# Patient Record
Sex: Female | Born: 1945 | Race: White | Hispanic: No | Marital: Single | Smoking: Never smoker
Health system: Southern US, Community
[De-identification: ages and names within clinical notes are randomized; demographics above are authoritative.]

## PROBLEM LIST (undated history)

## (undated) DIAGNOSIS — S42309A Unspecified fracture of shaft of humerus, unspecified arm, initial encounter for closed fracture: Secondary | ICD-10-CM

## (undated) HISTORY — PX: KNEE SURGERY: SHX244

## (undated) HISTORY — PX: TONSILLECTOMY AND ADENOIDECTOMY: SHX28

## (undated) HISTORY — PX: ABDOMINAL HYSTERECTOMY: SHX81

---

## 2021-07-19 ENCOUNTER — Other Ambulatory Visit: Payer: Self-pay

## 2021-07-19 ENCOUNTER — Encounter (HOSPITAL_COMMUNITY): Payer: Self-pay | Admitting: Emergency Medicine

## 2021-07-19 ENCOUNTER — Ambulatory Visit (INDEPENDENT_AMBULATORY_CARE_PROVIDER_SITE_OTHER): Payer: Medicare Other

## 2021-07-19 ENCOUNTER — Ambulatory Visit (HOSPITAL_COMMUNITY)
Admission: EM | Admit: 2021-07-19 | Discharge: 2021-07-19 | Disposition: A | Discharge status: Home / Self Care | Payer: Medicare Other | Attending: Internal Medicine | Admitting: Internal Medicine

## 2021-07-19 DIAGNOSIS — K5900 Constipation, unspecified: Secondary | ICD-10-CM | POA: Diagnosis not present

## 2021-07-19 DIAGNOSIS — R030 Elevated blood-pressure reading, without diagnosis of hypertension: Secondary | ICD-10-CM

## 2021-07-19 DIAGNOSIS — M62830 Muscle spasm of back: Secondary | ICD-10-CM | POA: Diagnosis not present

## 2021-07-19 DIAGNOSIS — H469 Unspecified optic neuritis: Secondary | ICD-10-CM | POA: Diagnosis not present

## 2021-07-19 DIAGNOSIS — S39012A Strain of muscle, fascia and tendon of lower back, initial encounter: Secondary | ICD-10-CM | POA: Diagnosis not present

## 2021-07-19 DIAGNOSIS — R102 Pelvic and perineal pain: Secondary | ICD-10-CM

## 2021-07-19 DIAGNOSIS — R1032 Left lower quadrant pain: Secondary | ICD-10-CM | POA: Diagnosis not present

## 2021-07-19 HISTORY — DX: Unspecified fracture of shaft of humerus, unspecified arm, initial encounter for closed fracture: S42.309A

## 2021-07-19 LAB — POCT URINALYSIS DIPSTICK, ED / UC
Bilirubin Urine: NEGATIVE
Glucose, UA: NEGATIVE mg/dL
Hgb urine dipstick: NEGATIVE
Ketones, ur: NEGATIVE mg/dL
Leukocytes,Ua: NEGATIVE
Nitrite: NEGATIVE
Protein, ur: NEGATIVE mg/dL
Specific Gravity, Urine: 1.01 (ref 1.005–1.030)
Urobilinogen, UA: 0.2 mg/dL (ref 0.0–1.0)
pH: 6 (ref 5.0–8.0)

## 2021-07-19 MED ORDER — BACLOFEN 10 MG PO TABS: ORAL_TABLET | ORAL | 0 refills | Status: AC

## 2021-07-19 MED ORDER — HYDROCODONE-ACETAMINOPHEN 5-325 MG PO TABS: ORAL_TABLET | ORAL | 0 refills | Status: AC

## 2021-07-19 NOTE — Discharge Instructions (Addendum)
Get Magnesium citrate bottle and drink it all to help you empty your bowels. Then from then on, to prevent it, take Magnesium citrate 200 mg to 400 mg at bed time. If your stool gets very loose back up on your dose. Also increase your fiber and water in your diet.  Do blood pressure diaries and record them to take it to your new family doctor next week.  Avoid taking Celebrex until your blood pressure is down to normal.

## 2021-07-19 NOTE — ED Provider Notes (Signed)
[facility]    CSN: [id number] Arrival date & time: 07/19/21  [id number]      History   Chief Complaint Chief Complaint  Patient presents with   Back Pain    HPI Jean Cannon is a 75 y.o. female who presents with L back pain/ hip pain  x 3 weeks. Stated after she bent down to tie her shoes. Pain is radiating to L abdomen. Today she is aching all over. Her clothes are even painful on her skin, but denies having a rash. Pain on hip is described as pulsating, and LLQ pain feels "crampy", L back feels " pulling pain" Felt beter this past weekend and was able to sleep. After getting out of bed Sunday( 2 days ago) the pain got worse again and since then is moving to suprapubic region. She denies constipation, dysuria, cloudy urine. Has been having HA on temples x 4-5 days. Denies vision changes. Admits of poor sleep in the past 3 weeks due to pain, and also been under a lot of stress. Denies Hx of HTN.  She is up to date on her colonoscopy and her last one was normal. Has had a hysterectomy. She has tried Tylenol, Tramadol, Celebrex and heat for pain, but has not helped.    Past Medical History:  Diagnosis Date   Arm fracture     There are no problems to display for this patient.   Past Surgical History:  Procedure Laterality Date   ABDOMINAL HYSTERECTOMY     KNEE SURGERY Right    TONSILLECTOMY AND ADENOIDECTOMY      OB History   No obstetric history on file.      Home Medications    Prior to Admission medications   Medication Sig Start Date End Date Taking? Authorizing Provider  baclofen (LIORESAL) 10 MG tablet 1-2 tid for muscle spasm and tightness 07/19/21  Yes Rodriguez-Southworth, Sunday Spillers, PA-C  HYDROcodone-acetaminophen (NORCO) 5-325 MG tablet 1/2 to 1 q 6h prn pain 07/19/21  Yes Rodriguez-Southworth, Sunday Spillers, PA-C    Family History Family History  Problem Relation Age of Onset   Alzheimer's disease Father     Social History Social History    Tobacco Use   Smoking status: Never   Smokeless tobacco: Never  Vaping Use   Vaping Use: Never used  Substance Use Topics   Alcohol use: Yes     Allergies   Advil [ibuprofen], Claritin [loratadine], Cortisone, and Zyrtec [cetirizine]   Review of Systems Review of Systems  Constitutional:  Positive for fatigue. Negative for appetite change, chills and fever.  HENT:  Negative for congestion.   Eyes:  Negative for visual disturbance.  Respiratory:  Negative for cough, chest tightness and shortness of breath.   Cardiovascular:  Negative for chest pain and leg swelling.  Gastrointestinal:  Positive for abdominal pain. Negative for abdominal distention, constipation, diarrhea, nausea and vomiting.  Genitourinary:  Positive for pelvic pain. Negative for difficulty urinating, dysuria, flank pain and frequency.  Musculoskeletal:  Positive for back pain. Negative for gait problem.  Skin:  Negative for color change, pallor, rash and wound.  Neurological:  Positive for headaches.       Temple HA  Hematological:  Negative for adenopathy.    Physical Exam Triage Vital Signs ED Triage Vitals  Enc Vitals Group     BP 07/19/21 1001 (!) 204/96     Pulse Rate 07/19/21 1001 80     Resp 07/19/21 1001 18     Temp 07/19/21  1001 98.4 F (36.9 C)     Temp Source 07/19/21 1001 Oral     SpO2 07/19/21 1001 99 %     Weight --      Height --      Head Circumference --      Peak Flow --      Pain Score 07/19/21 0953 10     Pain Loc --      Pain Edu? --      Excl. in GC? --    No data found.  Updated Vital Signs BP (!) 215/106 (BP Location: Right Arm) Comment (BP Location): repositioned   Pulse 80    Temp 98.4 F (36.9 C) (Oral)    Resp 18    SpO2 99%   Visual Acuity Right Eye Distance:   Left Eye Distance:   Bilateral Distance:    Right Eye Near:   Left Eye Near:    Bilateral Near:     Physical Exam Vitals and nursing note reviewed.  Constitutional:      General: She is in  acute distress.     Appearance: She is not ill-appearing or toxic-appearing.  HENT:     Head: Normocephalic.     Right Ear: External ear normal.     Left Ear: External ear normal.  Eyes:     General: No scleral icterus.    Extraocular Movements: Extraocular movements intact.     Conjunctiva/sclera: Conjunctivae normal.     Pupils: Pupils are equal, round, and reactive to light.  Cardiovascular:     Rate and Rhythm: Normal rate and regular rhythm.  Pulmonary:     Effort: Pulmonary effort is normal.     Breath sounds: Normal breath sounds.  Abdominal:     General: Bowel sounds are normal.     Palpations: Abdomen is soft.     Tenderness: There is no right CVA tenderness or left CVA tenderness.     Comments: Has mild tenderness on L and R mid abdomen, with no rebound or guarding  Musculoskeletal:        General: Normal range of motion.     Cervical back: Neck supple.     Comments: BACK- has local tenderness on L mid muscular area and over L iliac crest which did  reproduced her pain and when raising back up from anterior flexion. Spine ROM is normal and felt mild pain on lateral back area with lateral flexion. Neg SLR  Skin:    General: Skin is warm and dry.     Findings: No rash.     Comments: Has mild hyperalgesia over L lateral thorax and lateral hip, no rashes noted.   Neurological:     Mental Status: She is alert and oriented to person, place, and time.     Cranial Nerves: No cranial nerve deficit.     Motor: No weakness.     Gait: Gait normal.     Deep Tendon Reflexes: Reflexes normal.  Psychiatric:        Mood and Affect: Mood normal.        Behavior: Behavior normal.        Thought Content: Thought content normal.        Judgment: Judgment normal.     UC Treatments / Results  Labs (all labs ordered are listed, but only abnormal results are displayed) Labs Reviewed  URINALYSIS, ROUTINE W REFLEX MICROSCOPIC  POCT URINALYSIS DIPSTICK, ED / UC    EKG NSR, normal  EKG  Radiology DG Abd 2 Views  Result Date: 07/19/2021 CLINICAL DATA:  Left lower quadrant and suprapubic pain for 3 days EXAM: ABDOMEN - 2 VIEW COMPARISON:  None. FINDINGS: Generalized colonic stool. No concerning mass effect or gas collection. Clear lung bases. IMPRESSION: Diffuse colonic stool.  No impaction or obstruction. Electronically Signed   By: Jorje Guild M.D.   On: 07/19/2021 10:47    Procedures Procedures (including critical care time)  Medications Ordered in UC Medications - No data to display  Initial Impression / Assessment and Plan / UC Course  I have reviewed the triage vital signs and the nursing notes. Pertinent labs & imaging results that were available during my care of the patient were reviewed by me and considered in my medical decision making (see chart for details). L Lumbar strain with muscle spasm and neuritis along L3 dermatome. She may have had shingles with no rash, but she has had this for 10 days and antiviral will not help. She has allergy to Steroid, so cant place her on this to help the hyperalgesia.  Constipation- instructed to get Mag citrate. See instructions. I placed her on Baclofen and Norco. Medication precautions given.  Elevation of BP could just be from pain and lack of sleep, but she will monitor her BP and take BP diaries to her new PCP whom she will see next week when she returns to [place].      Final Clinical Impressions(s) / UC Diagnoses   Final diagnoses:  Strain of lumbar region, initial encounter  Muscle spasm of back  Optic neuritis  Elevated blood pressure, situational  Constipation, unspecified constipation type     Discharge Instructions      Get Magnesium citrate bottle and drink it all to help you empty your bowels. Then from then on, to prevent it, take Magnesium citrate 200 mg to 400 mg at bed time. If your stool gets very loose back up on your dose. Also increase your fiber and water in your diet.  Do blood  pressure diaries and record them to take it to your new family doctor next week.  Avoid taking Celebrex until your blood pressure is down to normal.      ED Prescriptions     Medication Sig Dispense Auth. Provider   baclofen (LIORESAL) 10 MG tablet 1-2 tid for muscle spasm and tightness 30 each Rodriguez-Southworth, Burrell Hodapp, PA-C   HYDROcodone-acetaminophen (NORCO) 5-325 MG tablet 1/2 to 1 q 6h prn pain 10 tablet Rodriguez-Southworth, Sunday Spillers, PA-C      I have reviewed the PDMP during this encounter.   Shelby Mattocks, PA-C 07/19/21 1211

## 2021-07-19 NOTE — ED Triage Notes (Signed)
2 weeks ago, bent down to tie shoe and couldn't get back up.  Pain in left hip.  Then started getting worse.  Pain worsens at night.  Pain is now radiating to left lower back and radiating around to abdomen.  Today is aching all over .  And reports clothing is painful on skin.  Denies rash Have tried tylenol, extra strength, had one tramadol to try and had celebrex to try (left over medicines).  Neither helped.  Patient has used heating pad.  "Pulsing pain" in hip, left lower abdomen feels like cramps and lower left back feels pulling pain

## 2022-12-22 IMAGING — DX DG ABDOMEN 2V
3 series · 3 of 3 positions shown · non-contrast
Comparison: None.

CLINICAL DATA: Left lower quadrant and suprapubic pain for 3 days

EXAM:
ABDOMEN - 2 VIEW

[abdomen erect]
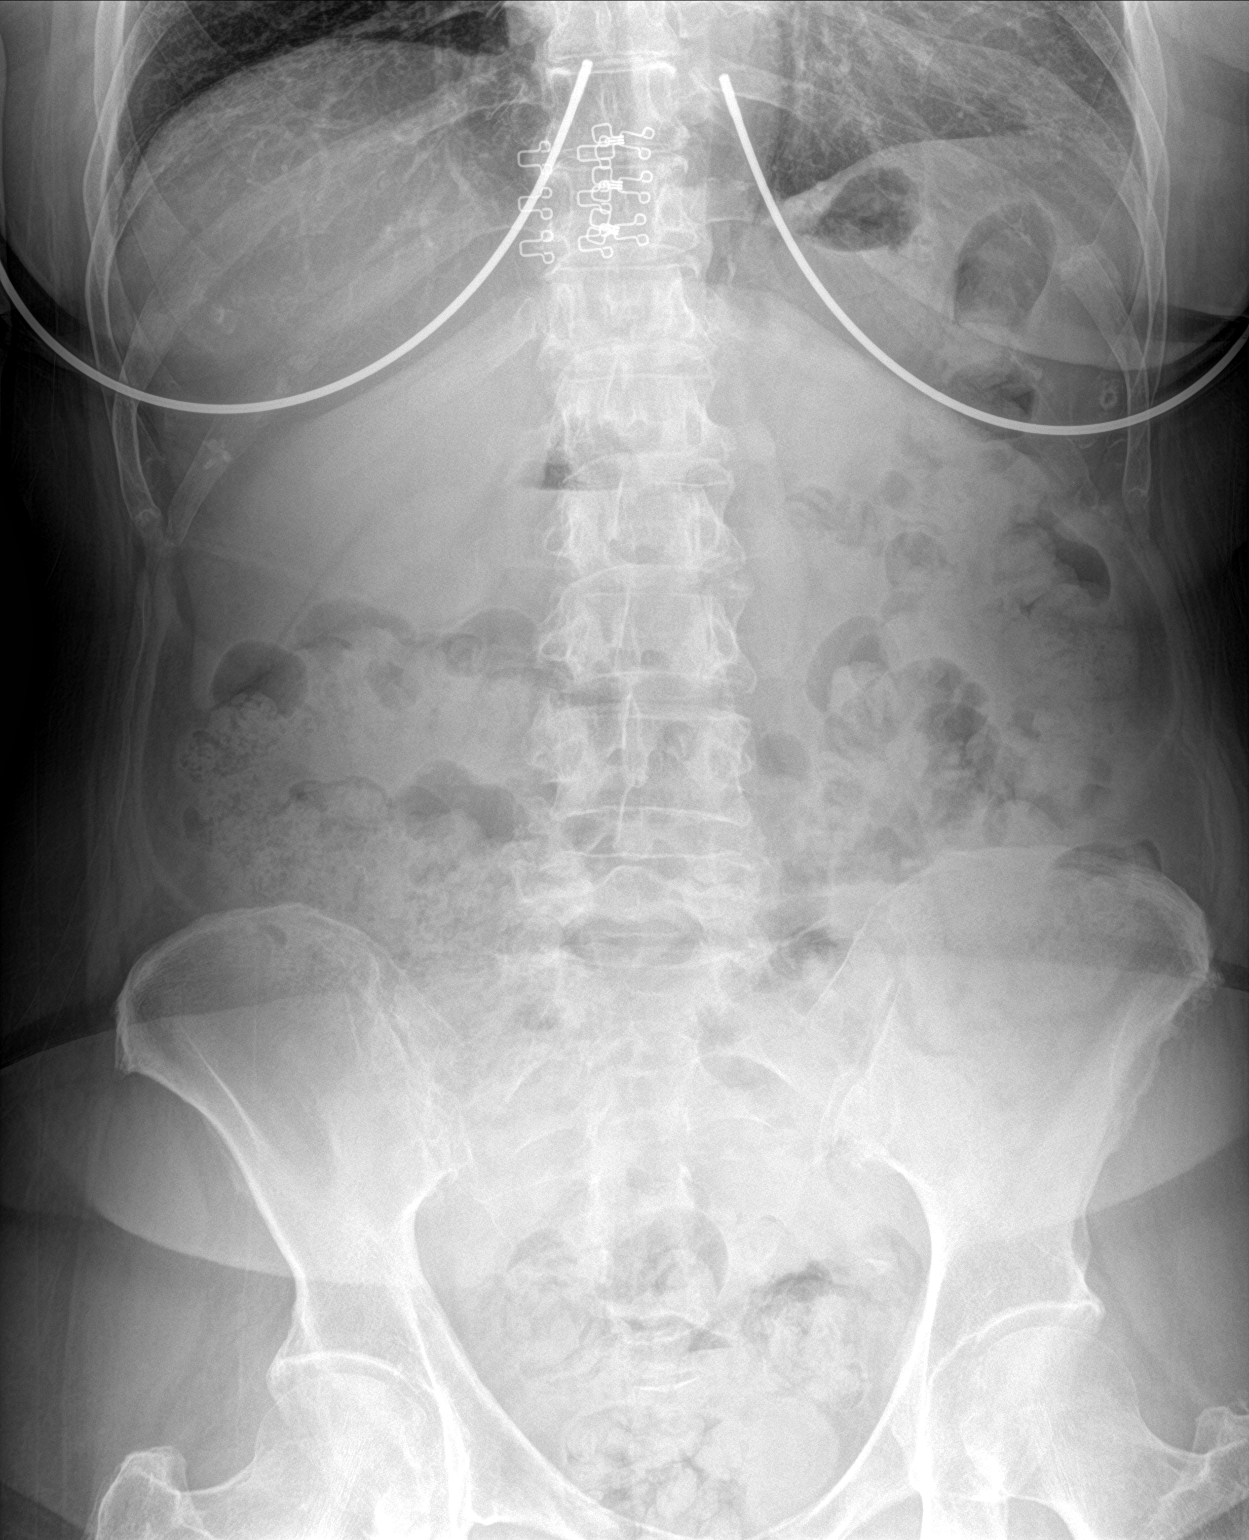

[abdomen supine (1 of 2)]
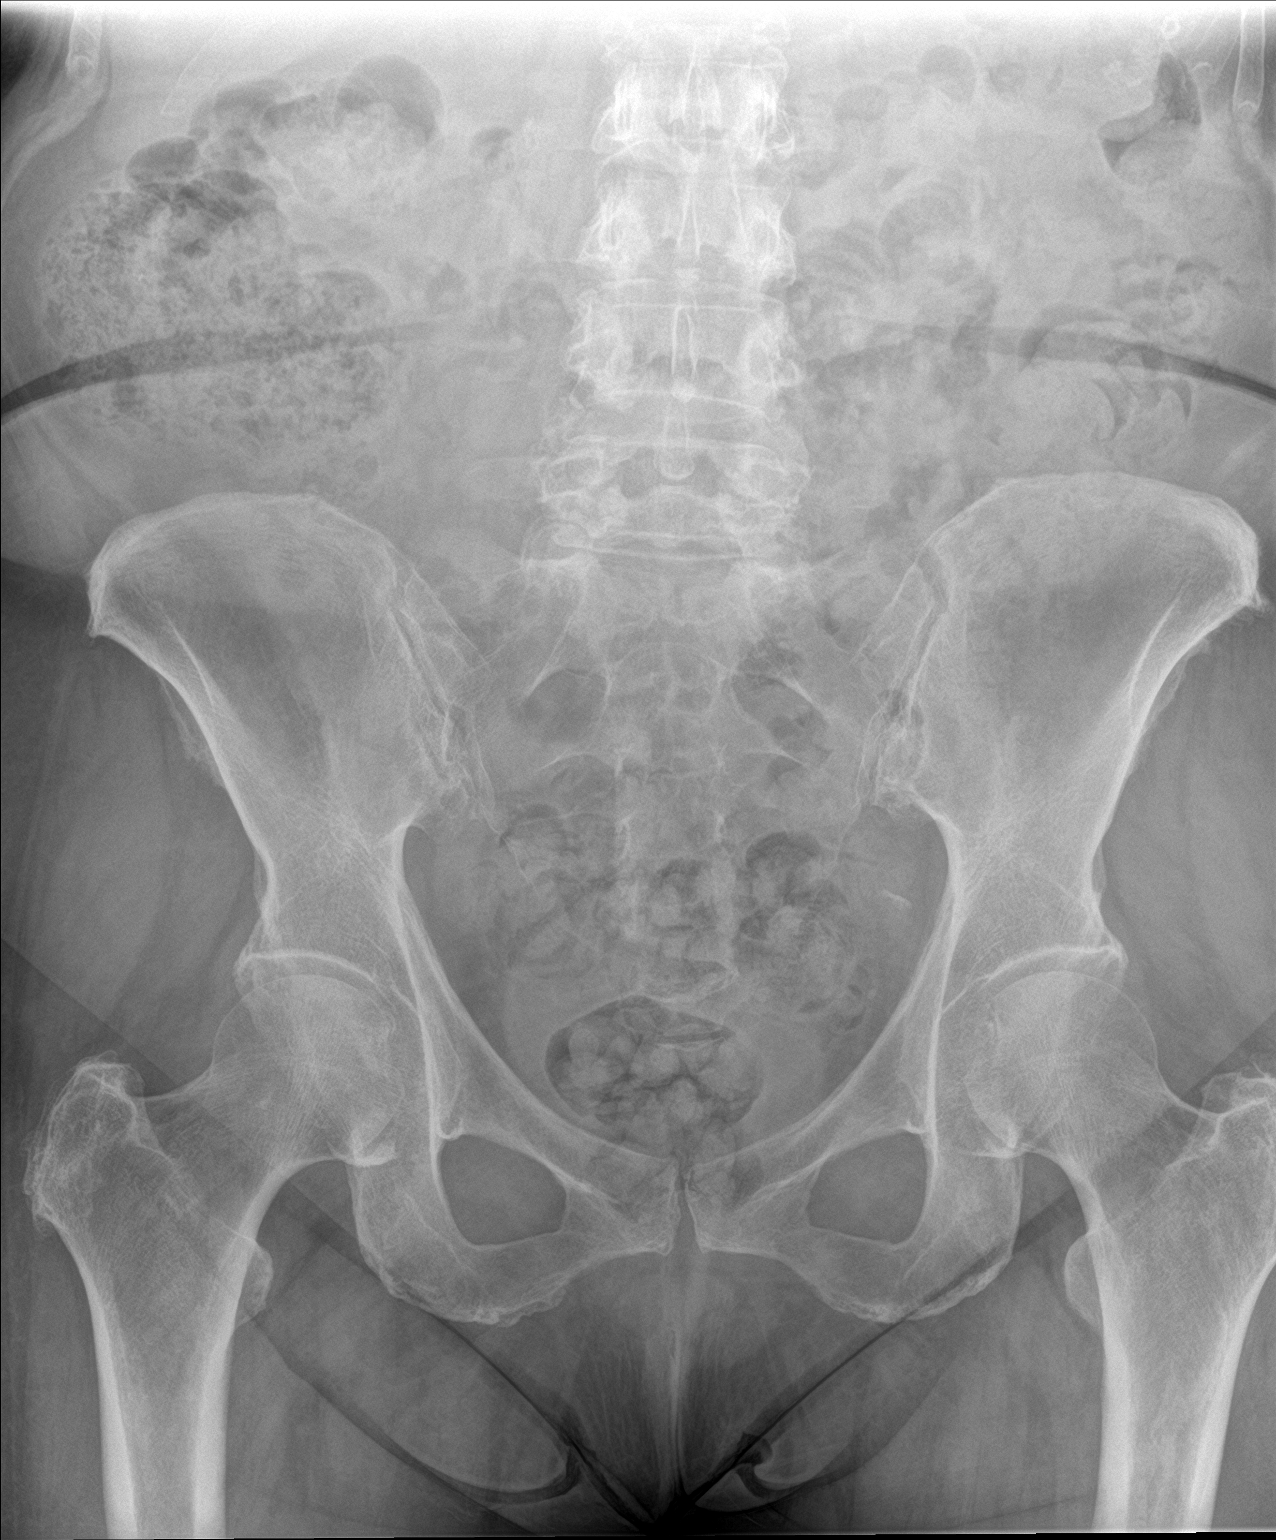

[abdomen supine (2 of 2)]
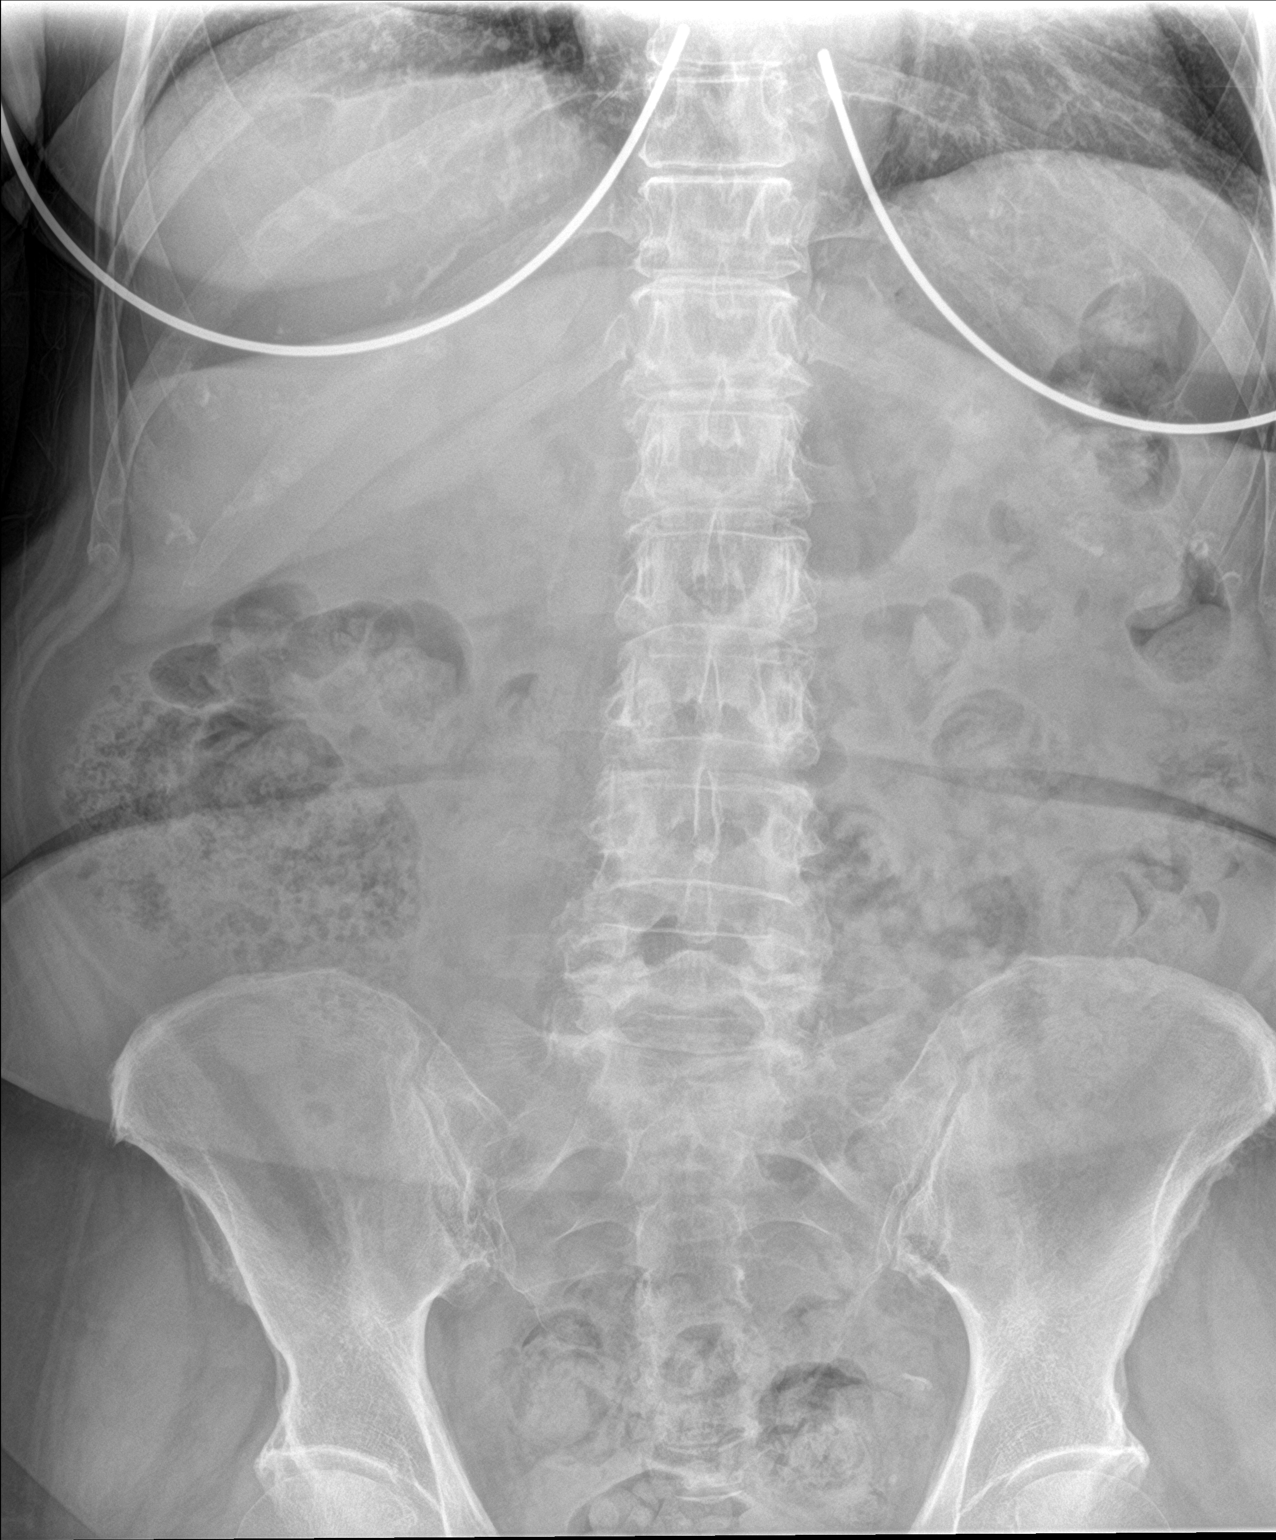

[3 of 3 positions shown; findings below may reference images not displayed]

FINDINGS: Generalized colonic stool. No concerning mass effect or gas
collection. Clear lung bases.
IMPRESSION: Diffuse colonic stool.  No impaction or obstruction.
